# Patient Record
Sex: Female | Born: 1945 | Race: Asian | Hispanic: No | Smoking: Never smoker
Health system: Southern US, Community
[De-identification: ages and names within clinical notes are randomized; demographics above are authoritative.]

---

## 1999-06-09 ENCOUNTER — Other Ambulatory Visit: Admission: RE | Admit: 1999-06-09 | Discharge: 1999-06-09 | Payer: Self-pay | Admitting: Family Medicine

## 1999-06-21 ENCOUNTER — Encounter: Payer: Self-pay | Admitting: Family Medicine

## 1999-06-21 ENCOUNTER — Encounter: Admission: RE | Admit: 1999-06-21 | Discharge: 1999-06-21 | Payer: Self-pay | Admitting: Family Medicine

## 1999-07-11 ENCOUNTER — Encounter (INDEPENDENT_AMBULATORY_CARE_PROVIDER_SITE_OTHER): Payer: Self-pay

## 1999-07-11 ENCOUNTER — Ambulatory Visit (HOSPITAL_COMMUNITY): Admission: RE | Admit: 1999-07-11 | Discharge: 1999-07-11 | Payer: Self-pay | Admitting: Gastroenterology

## 2000-07-12 ENCOUNTER — Other Ambulatory Visit: Admission: RE | Admit: 2000-07-12 | Discharge: 2000-07-12 | Payer: Self-pay | Admitting: Family Medicine

## 2002-11-17 ENCOUNTER — Encounter (INDEPENDENT_AMBULATORY_CARE_PROVIDER_SITE_OTHER): Payer: Self-pay | Admitting: *Deleted

## 2002-11-17 ENCOUNTER — Ambulatory Visit (HOSPITAL_COMMUNITY): Admission: RE | Admit: 2002-11-17 | Discharge: 2002-11-17 | Payer: Self-pay | Admitting: Gastroenterology

## 2002-12-22 ENCOUNTER — Encounter: Admission: RE | Admit: 2002-12-22 | Discharge: 2002-12-22 | Payer: Self-pay | Admitting: Family Medicine

## 2002-12-22 ENCOUNTER — Encounter: Payer: Self-pay | Admitting: Family Medicine

## 2010-04-21 ENCOUNTER — Encounter: Admission: RE | Admit: 2010-04-21 | Discharge: 2010-04-21 | Payer: Self-pay | Admitting: Internal Medicine

## 2010-10-06 NOTE — Op Note (Signed)
   NAME:  Megan Stewart, Megan Stewart                        ACCOUNT NO.:  [id number]   MEDICAL RECORD NO.:  [id number]                   PATIENT TYPE:  AMB   LOCATION:  ENDO                                 FACILITY:  [facility]   PHYSICIAN:  James L. Malon Kindle., M.D.          DATE OF BIRTH:  1945-08-23   DATE OF PROCEDURE:  11/17/2002  DATE OF DISCHARGE:                                 OPERATIVE REPORT   PROCEDURE:  Colonoscopy with polypectomy.   MEDICATIONS:  Fentanyl 62.5 mcg, Versed 5 mg IV.   INDICATIONS FOR PROCEDURE:  This is done as a followup for a previous  history of adenomatous colon polyps.   DESCRIPTION OF PROCEDURE:  The procedure had been explained to the patient  and consent obtained. With the patient in the left lateral decubitus  position, the Olympus pediatric adjustable colonoscope was inserted and  advanced under direct visualization. The prep was excellent. We were able to  advance to the cecum using abdominal pressure. The ileocecal valve and  appendiceal orifice were seen.  The scope was withdrawn and the cecum,  ascending colon, hepatic flexure, transverse,  descending and sigmoid colon  were seen well. No polyps were seen. The descending colon was also seen  well. In the distal sigmoid, approximately 25 cm from the anal verge, a 1/2  cm sessile polyp was encountered and was removed with the snare and sucked  through the scope. There was no significant diverticular disease. The scope  was withdrawn, no other significant findings. The patient tolerated the  procedure well.   ASSESSMENT:  Sigmoid colon polyp removed.   PLAN:  Will check path. Routine post polypectomy instructions. Will  recommend repeating procedure in three years.                                               James L. Malon Kindle., M.D.    Waldron Session  D:  11/17/2002  T:  11/17/2002  Job:  [id number]   cc:   Mosetta Putt, M.D.  [address] [place]  [place] [address]  Fax: [phone]

## 2012-01-16 ENCOUNTER — Ambulatory Visit
Admission: RE | Admit: 2012-01-16 | Discharge: 2012-01-16 | Disposition: A | Discharge status: Home / Self Care | Payer: BC Managed Care – PPO | Source: Ambulatory Visit | Attending: Internal Medicine | Admitting: Internal Medicine

## 2012-01-16 ENCOUNTER — Other Ambulatory Visit: Payer: Self-pay | Admitting: Internal Medicine

## 2012-01-16 DIAGNOSIS — R05 Cough: Secondary | ICD-10-CM

## 2012-09-29 ENCOUNTER — Other Ambulatory Visit: Payer: Self-pay | Admitting: Internal Medicine

## 2012-09-29 DIAGNOSIS — E213 Hyperparathyroidism, unspecified: Secondary | ICD-10-CM

## 2012-09-30 ENCOUNTER — Ambulatory Visit
Admission: RE | Admit: 2012-09-30 | Discharge: 2012-09-30 | Disposition: A | Discharge status: Home / Self Care | Payer: BC Managed Care – PPO | Source: Ambulatory Visit | Attending: Internal Medicine | Admitting: Internal Medicine

## 2012-09-30 DIAGNOSIS — E213 Hyperparathyroidism, unspecified: Secondary | ICD-10-CM

## 2013-01-09 ENCOUNTER — Other Ambulatory Visit: Payer: Self-pay | Admitting: Internal Medicine

## 2013-01-09 DIAGNOSIS — N951 Menopausal and female climacteric states: Secondary | ICD-10-CM

## 2013-01-12 ENCOUNTER — Ambulatory Visit
Admission: RE | Admit: 2013-01-12 | Discharge: 2013-01-12 | Disposition: A | Discharge status: Home / Self Care | Payer: BC Managed Care – PPO | Source: Ambulatory Visit | Attending: Internal Medicine | Admitting: Internal Medicine

## 2013-01-12 DIAGNOSIS — N951 Menopausal and female climacteric states: Secondary | ICD-10-CM

## 2013-06-30 ENCOUNTER — Ambulatory Visit
Admission: RE | Admit: 2013-06-30 | Discharge: 2013-06-30 | Disposition: A | Discharge status: Home / Self Care | Payer: BC Managed Care – PPO | Source: Ambulatory Visit | Attending: Internal Medicine | Admitting: Internal Medicine

## 2013-06-30 ENCOUNTER — Other Ambulatory Visit: Payer: Self-pay | Admitting: Internal Medicine

## 2013-06-30 DIAGNOSIS — S66819A Strain of other specified muscles, fascia and tendons at wrist and hand level, unspecified hand, initial encounter: Principal | ICD-10-CM

## 2013-06-30 DIAGNOSIS — S63599A Other specified sprain of unspecified wrist, initial encounter: Secondary | ICD-10-CM

## 2013-06-30 IMAGING — CR Imaging study
2 series · 2 of 2 positions shown · non-contrast
Comparison: None.

CLINICAL DATA: Right-sided wrist pain

EXAM:
RIGHT WRIST - 2 VIEW

[view not recorded (1 of 2)]
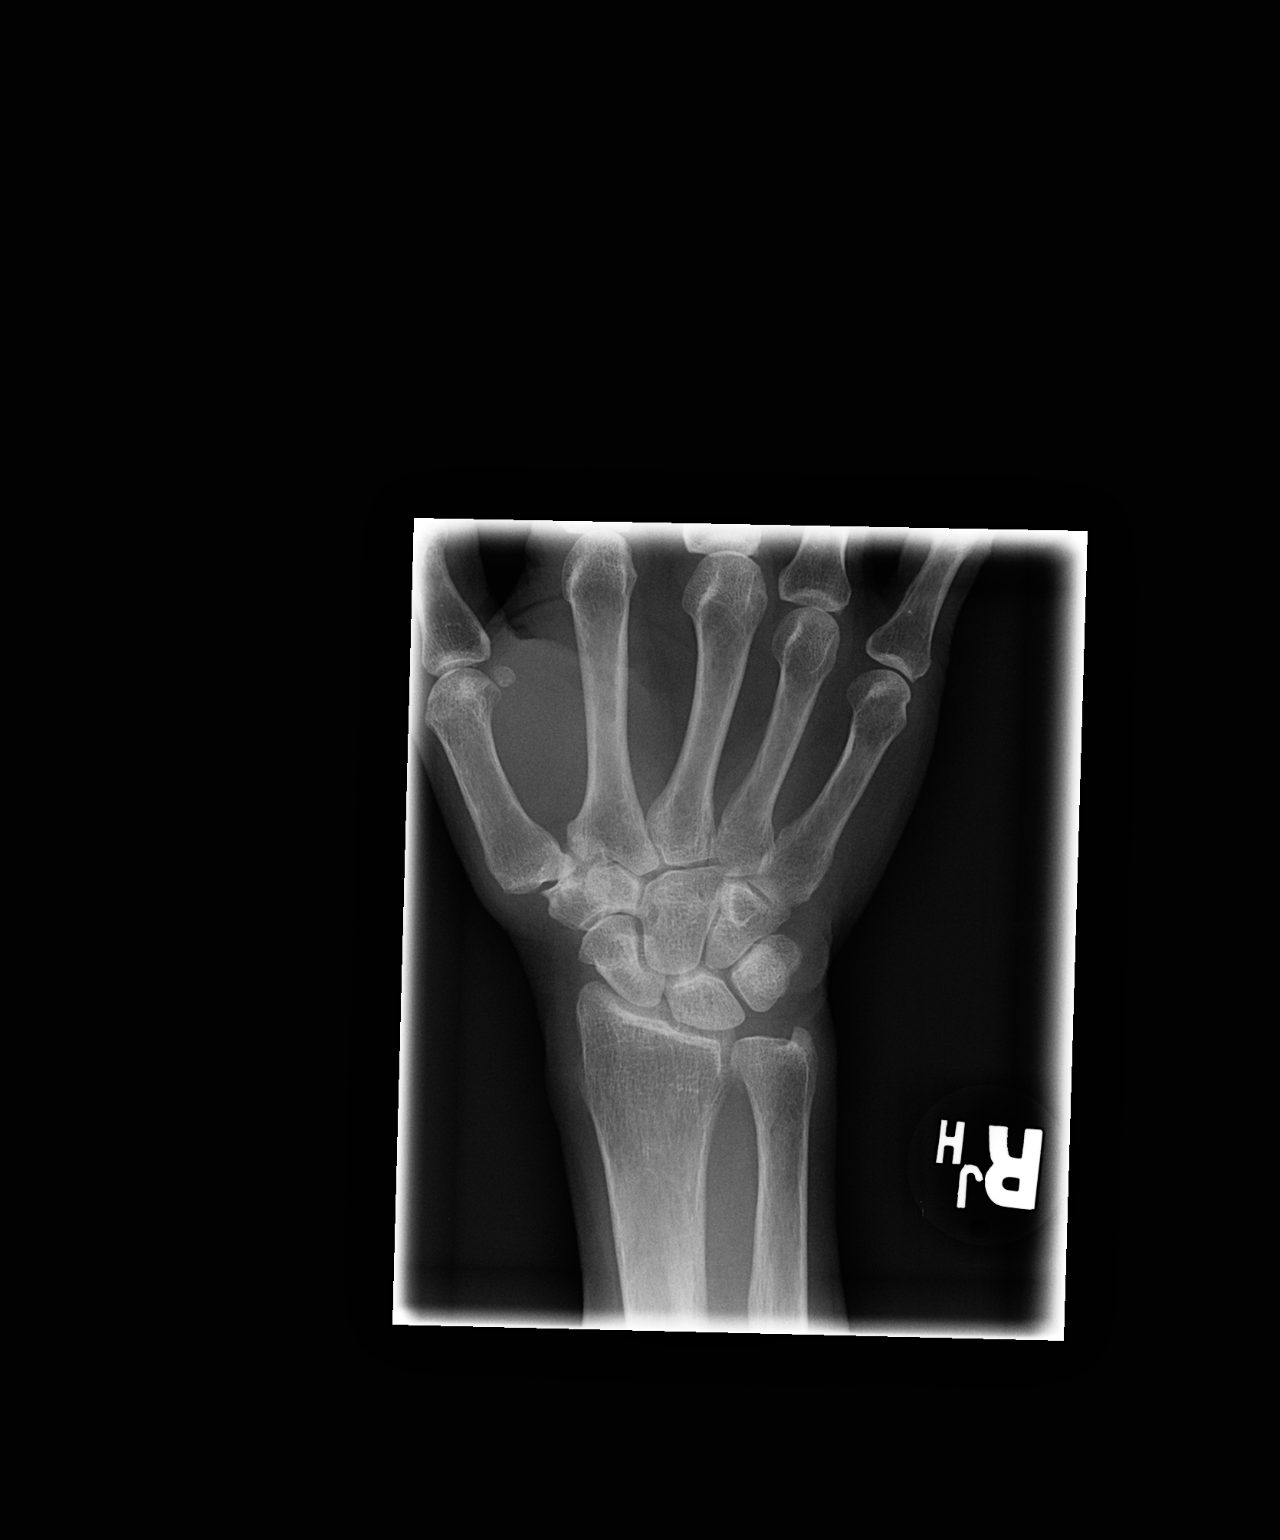

[view not recorded (2 of 2)]
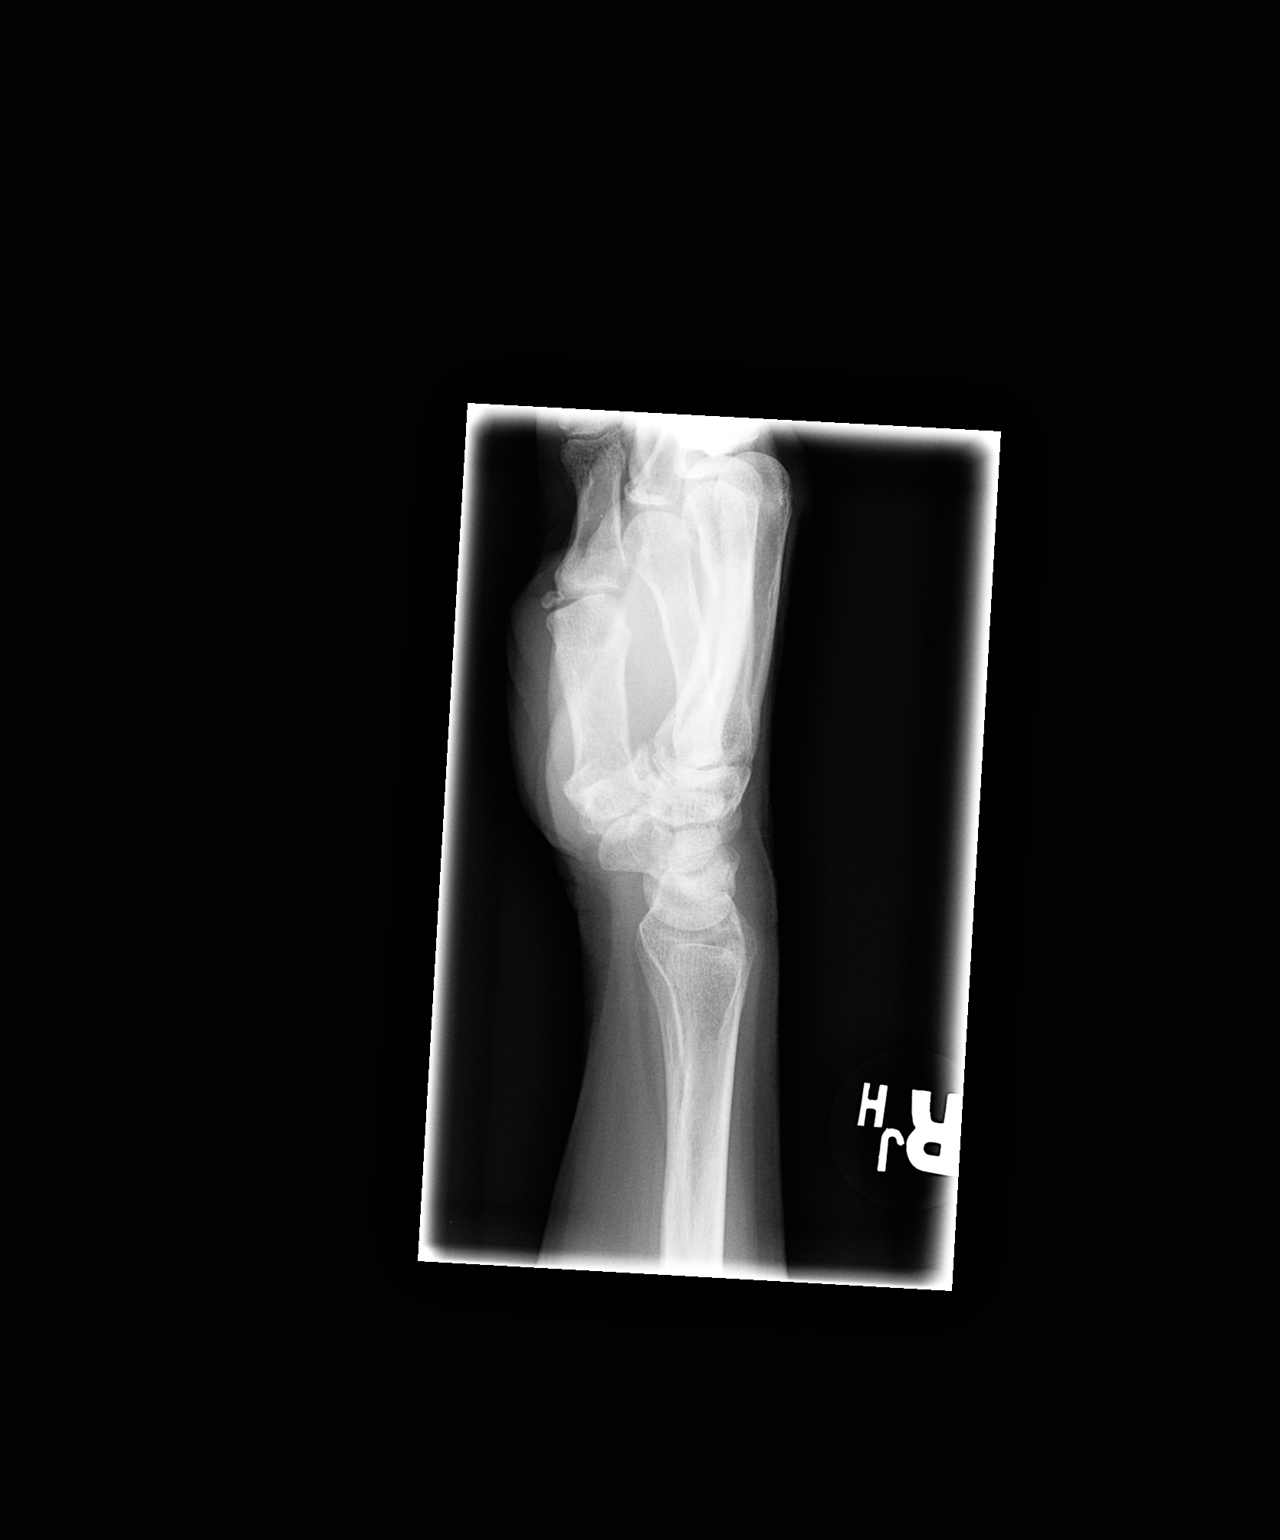

[2 of 2 positions shown; findings below may reference images not displayed]

FINDINGS: Mild degenerative changes are noted at the 1st CMC joint. No acute
fracture or dislocation is noted. No soft tissue changes are noted.
IMPRESSION: Mild degenerative changes.

## 2013-07-28 ENCOUNTER — Encounter (INDEPENDENT_AMBULATORY_CARE_PROVIDER_SITE_OTHER): Payer: Self-pay | Admitting: Surgery

## 2013-07-28 ENCOUNTER — Ambulatory Visit (INDEPENDENT_AMBULATORY_CARE_PROVIDER_SITE_OTHER): Payer: BC Managed Care – PPO | Admitting: Surgery

## 2013-07-28 ENCOUNTER — Encounter (INDEPENDENT_AMBULATORY_CARE_PROVIDER_SITE_OTHER): Payer: Self-pay

## 2013-07-28 VITALS — BP 100/64 | HR 70 | Temp 98.3°F | Resp 14 | Ht 64.0 in | Wt 120.8 lb

## 2013-07-28 DIAGNOSIS — E349 Endocrine disorder, unspecified: Secondary | ICD-10-CM

## 2013-07-28 NOTE — Progress Notes (Signed)
General Surgery [facility]  Chief Complaint  Patient presents with  . New Evaluation    elevated intact PTH level - referral from Dr. Allena Napoleon    HISTORY: Patient is a 68 year old woman referred by her primary care physician for evaluation of an elevated intact PTH level. Her most recent PTH level was elevated at 80.1 with a range of normal being 14-72. Concurrently her calcium level was high normal at 10.4. Her vitamin D level was normal at 58. Patient had a thyroid ultrasound performed last year which showed a normal thyroid gland without nodules and no evidence of a parathyroid adenoma.  Patient has had some mild bone and joint discomfort, particularly in the left wrist. She denies fatigue. She denies nephrolithiasis.  There is a history of thyroid disease and the patient's brother who underwent thyroid surgery. There is no family history of other endocrine neoplasm.  History reviewed. No pertinent past medical history.  No current outpatient prescriptions on file.   No current facility-administered medications for this visit.    No Known Allergies  Family History  Problem Relation Age of Onset  . Colon cancer Mother   . Liver cancer Mother   . Stroke Father     History   Social History  . Marital Status: Widowed    Spouse Name: N/A    Number of Children: N/A  . Years of Education: N/A   Social History Main Topics  . Smoking status: Never Smoker   . Smokeless tobacco: None  . Alcohol Use: No  . Drug Use: 1.00 per week  . Sexual Activity: None   Other Topics Concern  . None   Social History Narrative  . None    REVIEW OF SYSTEMS - PERTINENT POSITIVES ONLY: Denies fractures. Denies nephrolithiasis. Personal history of osteopenia. Denies tremor. Denies palpitations. Denies compressive symptoms.  EXAM: Filed Vitals:   07/28/13 1503  BP: 100/64  Pulse: 70  Temp: 98.3 F (36.8 C)  Resp: 14    GENERAL: well-developed,  well-nourished, no acute distress HEENT: normocephalic; pupils equal and reactive; sclerae clear; dentition good; mucous membranes moist NECK:  No palpable nodules in the thyroid bed; symmetric on extension; no palpable anterior or posterior cervical lymphadenopathy; no supraclavicular masses; no tenderness CHEST: clear to auscultation bilaterally without rales, rhonchi, or wheezes CARDIAC: regular rate and rhythm without significant murmur; peripheral pulses are full EXT:  non-tender without edema; no deformity NEURO: no gross focal deficits; no sign of tremor   LABORATORY RESULTS: See [facility] ([facility]L-Epic) for most recent results  RADIOLOGY RESULTS: See [facility] (CHL-Epic) for most recent results  IMPRESSION: #1 elevated intact PTH level, rule out primary hyperparathyroidism  PLAN: I reviewed the above studies at length with the patient. We looked at her ultrasound results and her laboratory studies. At this point she has an elevated intact PTH level. Calcium levels have always been below a level. She does have a history of osteopenia but no other complications related to hypercalcemia.  I would like to obtain a 24-hour urine collection for calcium. We will also obtain a nuclear medicine parathyroid scan with sestamibi. Once those results are available I will contact the patient and discuss further options for evaluation or management.  I provided the patient with written literature on parathyroid disease to review at home. We also discussed the possibility of parathyroid surgery and I explained minimally invasive parathyroidectomy and its risks to the patient.  Velora Heckler, MD, FACS General &  Endocrine Surgery [facility]  Primary Care Physician: Mia CreekVAUGHAN,ELIZABETH, MD

## 2013-07-28 NOTE — Patient Instructions (Signed)
Parathyroid Hormone This is a test to determine whether PTH levels are responding normally to changes in blood calcium levels. It also helps to distinguish the cause of calcium imbalances, and to evaluate parathyroid function. When calcium blood levels are higher or lower than normal, and when your caregiver may want to determine how well your parathyroid glands are working. Parathyroid hormone (PTH) helps the body maintain stable levels of calcium in the blood. It is part of a 'feedback loop' that includes calcium, PTH, vitamin D, and, to some extent, phosphate and magnesium. Conditions and diseases that disrupt this feedback loop can cause inappropriate elevations or decreases in calcium and PTH levels and lead to symptoms of hypercalcemia (raised blood levels of calcium) or hypocalcemia (low blood levels of calcium).  PTH is produced by four parathyroid glands that are located in the neck beside the thyroid gland. Normally, these glands secrete PTH into the bloodstream in response to low blood calcium levels. Parathyroid hormone then works in three ways to help raise blood calcium levels back to normal. It takes calcium from the body's bone, stimulates the activation of vitamin D in the kidney (which in turn increases the absorption of calcium from the intestines), and suppresses the excretion of calcium in the urine (while encouraging excretion of phosphate). As calcium levels begin to increase in the blood, PTH normally decreases. PREPARATION FOR TEST You should have nothing to eat or drink except for water after midnight on the day of the test or as directed by your caregiver. A blood sample is obtained by inserting a needle into a vein in the arm. NORMAL FINDINGS Conventional Normal  PTH intact (whole)  Assay includes intact PTH  Values (pg/mL)10-65  SI Units (ng/L)10-65  PTH N-terminalN-terminal  Values (pg/mL) 8-24  SI Units (ng/L)8-24  PTH C-terminal  Assay Includes  C-terminal  Values (pg/mL) 50-330  SI Units (ng/L) 50-330  Intact PTH  Midmolecule Ranges for normal findings may vary among different laboratories and hospitals. You should always check with your doctor after having lab work or other tests done to discuss the meaning of your test results and whether your values are considered within normal limits. MEANING OF TEST  Your caregiver will go over the test results with you and discuss the importance and meaning of your results, as well as treatment options and the need for additional tests if necessary. OBTAINING THE TEST RESULTS  It is your responsibility to obtain your test results. Ask the lab or department performing the test when and how you will get your results. Document Released: 06/09/2004 Document Revised: 07/30/2011 Document Reviewed: 04/18/2008 Exit[employer] Patient Information 2014 [employer].  

## 2013-09-10 ENCOUNTER — Encounter (HOSPITAL_COMMUNITY): Payer: BC Managed Care – PPO

## 2013-09-25 ENCOUNTER — Telehealth (INDEPENDENT_AMBULATORY_CARE_PROVIDER_SITE_OTHER): Payer: Self-pay

## 2013-09-25 NOTE — Telephone Encounter (Signed)
Message copied by Joanette GulaSMITHEY, Leo Fray on Fri Sep 25, 2013  4:56 PM ------      Message from: Kerrin ChampagneLOWERY, TENA M      Created: Fri Sep 25, 2013  4:41 PM      Contact: [phone]       Pt Needs to  Reschedule thyroid test on MAY 15. ------

## 2013-09-25 NOTE — Telephone Encounter (Signed)
Pt given phone # to call and r/s test to a date that is better for her.

## 2013-10-02 ENCOUNTER — Ambulatory Visit (HOSPITAL_COMMUNITY): Payer: BC Managed Care – PPO

## 2013-10-02 ENCOUNTER — Encounter (HOSPITAL_COMMUNITY): Payer: BC Managed Care – PPO

## 2013-10-23 ENCOUNTER — Encounter (HOSPITAL_COMMUNITY)
Admission: RE | Admit: 2013-10-23 | Discharge: 2013-10-23 | Disposition: A | Discharge status: Home / Self Care | Payer: BC Managed Care – PPO | Source: Ambulatory Visit | Attending: Surgery | Admitting: Surgery

## 2013-10-23 ENCOUNTER — Ambulatory Visit (HOSPITAL_COMMUNITY)
Admission: RE | Admit: 2013-10-23 | Discharge: 2013-10-23 | Disposition: A | Discharge status: Home / Self Care | Payer: BC Managed Care – PPO | Source: Ambulatory Visit | Attending: Surgery | Admitting: Surgery

## 2013-10-23 ENCOUNTER — Encounter (HOSPITAL_COMMUNITY): Admission: RE | Admit: 2013-10-23 | Payer: BC Managed Care – PPO | Source: Ambulatory Visit

## 2013-10-23 DIAGNOSIS — E213 Hyperparathyroidism, unspecified: Secondary | ICD-10-CM | POA: Insufficient documentation

## 2013-10-23 DIAGNOSIS — E349 Endocrine disorder, unspecified: Secondary | ICD-10-CM

## 2013-10-23 MED ORDER — TECHNETIUM TC 99M SESTAMIBI - CARDIOLITE
25.0000 | Freq: Once | INTRAVENOUS | Status: AC | PRN
  Administered 2013-10-23: 12:00:00 25 via INTRAVENOUS

## 2013-10-26 ENCOUNTER — Other Ambulatory Visit (INDEPENDENT_AMBULATORY_CARE_PROVIDER_SITE_OTHER): Payer: Self-pay | Admitting: Surgery

## 2013-10-27 LAB — CALCIUM, URINE, 24 HOUR
Calcium, 24 hour urine: 185 mg/d (ref 100–250)
Calcium, Ur: 9 mg/dL

## 2013-10-27 NOTE — Progress Notes (Signed)
Quick Note:  Nuclear scan shows no evidence of parathyroid adenoma. Previous USN shows no sign of adenoma.  24 hour urine collection normal for calcium excretion.  Calcium is under 11.  To Dr. Alessandra Bevels: Lanora Manis, while patient may have low-grade primary hyperparathyroidism, I would recommend continued monitoring with a repeat intact-PTH level and calcium level in 6 months. Please forward these results to me for review. I would not recommend exploratory surgery at this time.  Velora Heckler, MD, FACS General & Endocrine Surgery [facility] Office: [phone]  ______

## 2013-10-27 NOTE — Progress Notes (Signed)
Quick Note:  24 hour urinary calcium is normal.  Velora Heckler, MD, [facility] Office: [phone]   ______

## 2014-05-31 ENCOUNTER — Ambulatory Visit: Payer: BC Managed Care – PPO | Admitting: Sports Medicine

## 2014-06-15 ENCOUNTER — Encounter: Payer: Self-pay | Admitting: Sports Medicine

## 2014-06-15 ENCOUNTER — Ambulatory Visit (INDEPENDENT_AMBULATORY_CARE_PROVIDER_SITE_OTHER): Payer: BLUE CROSS/BLUE SHIELD | Admitting: Sports Medicine

## 2014-06-15 VITALS — BP 129/83 | Ht 64.0 in | Wt 120.0 lb

## 2014-06-15 DIAGNOSIS — M778 Other enthesopathies, not elsewhere classified: Secondary | ICD-10-CM | POA: Diagnosis not present

## 2014-06-15 DIAGNOSIS — M25532 Pain in left wrist: Secondary | ICD-10-CM | POA: Diagnosis not present

## 2014-06-15 MED ORDER — NITROGLYCERIN 0.2 MG/HR TD PT24: MEDICATED_PATCH | TRANSDERMAL | Status: DC

## 2014-06-15 NOTE — Assessment & Plan Note (Signed)
Rarely uses meds Modify lifting  Use compression wrap

## 2014-06-15 NOTE — Assessment & Plan Note (Signed)
Trial with topical NTG Compression HEP with ball squeezes  Reck 6 wks

## 2014-06-15 NOTE — Patient Instructions (Signed)

## 2014-06-15 NOTE — Progress Notes (Signed)
Subjective:    Patient ID: Megan Stewart, female    DOB: 12/31/1945, 69 y.o.   MRN: [id number]  HPI Ms. Megan Stewart is a 69 yo female who presents with left wrist pain. The pain has been present for the past year. The pain is located over the anatomical snuff box and is associated with persistent swelling. In February of 2015, she had radiographs performed which showed degenerative changes within the radiocarpal joint and scaphoid remodeling. Since that time she has continued to have pain and swelling. While visiting her daughter in September, she had repeat radiographs performed which showed scapholunate ligament injury. She was reportedly placed into a splint for the next 7-8 weeks, and has since started PT. She reports an improvement in movement since starting the PT, but continues to have swelling and pain over the anatomical snuff box. The pain is worsened with movement, specifically flexion and eversion. She has not tried any medications for pain relief at this time. She does not report any specific injury or fall that initiated this pain. She does report travelling quite a bit, and may have hyperextended her wrist placing luggage in the overhead compartment. She has no numbness or tingling, no discoloration. The pain is localized and does not radiate.    Review of Systems  Negative other than noted in HPI.      Objective:   Physical Exam Filed Vitals:   06/15/14 1041  BP: 129/83   General: well-appearing, pleasant female in no acute distress  Left Wrist:  Inspection: prominent swelling over the anatomical snuff box; no discoloration  Palpation: palpable swelling over the anatomical snuff box; no tenderness to palpation over the scaphoid, lunate, pisiform, hamate, radial or ulnar styloids ROM: decreased by approximately 10 degrees with flexion, full extension, eversion and inversion; flexion and eversion painful Strength: 5/5 with flexion, extension, eversion, inversion, and flexion and  extension of fingers Special: pos Watson's test, neg Phalen's, neg Tinel's  Neurovascular: neurovascularly intact distally  Right Wrist:  Inspection: no swelling, discoloration, or deformity  Palpation: no tenderness to palpation over the scaphoid, lunate, pisiform, hamate, radial or ulnar styloids ROM: full ROM with flexion,extension, eversion and inversion  Strength: 5/5 with flexion, extension, eversion, inversion, and flexion and extension of fingers Special: pos Watson's test, neg Phalen's, neg Tinel's  Neurovascular: neurovascularly intact distally  MSK U/S: Ultrasound examination of the left wrist was performed in long and short axis. Compartments 1-6 were visualized. Compartment 2 showed hypoechoic region within the extensor carpi radialis longus tendon, suggestive of a tear. Scapholunate ligament laxity was visualized with dynamic testing. A hypoechoic cystic region was visualized over the anatomical snuff box, consistent with ganglion cyst formation. Spurring was seen on the dorsal aspect of the lunate.       Assessment & Plan:   1. Left Wrist Pain:  Ms. Megan Stewart's left wrist pain is most likely multifactorial in etiology. She likely has some degenerative changes, supported by the previous radiographs as well as spurring on ultrasound examination today. She also has had a scapholunate ligament tear as suggested by previous radiographs and the laxity of the ligament on examination today. The ligament laxity likely allowed for increased motion of the lunate bone, and caused damage to the overlying extensor carpi radialis longus tendon. Her previous splint likely has allowed for significant healing of the scapholunate ligament, but the tendon is still damaged. The tearing of this tendon, likely caused formation of the ganglion cyst.   - Provided patient with body helix compression  sleeve to limit swelling; encouraged to wear throughout the day  - Will start NTG protocol for 6 weeks to speed  up the healing process of the torn tendon  Follow-up in 6-8 weeks or sooner if needed.     This patient was seen with DR Darrick Penna and note was dictated by Gorden Harms, MS4.   I have reviewed and edited.  Vedia Coffer

## 2014-07-28 ENCOUNTER — Ambulatory Visit: Payer: BLUE CROSS/BLUE SHIELD | Admitting: Sports Medicine

## 2014-09-14 ENCOUNTER — Ambulatory Visit (INDEPENDENT_AMBULATORY_CARE_PROVIDER_SITE_OTHER): Payer: BLUE CROSS/BLUE SHIELD | Admitting: Sports Medicine

## 2014-09-14 ENCOUNTER — Encounter: Payer: Self-pay | Admitting: Sports Medicine

## 2014-09-14 VITALS — BP 118/60 | Ht 64.0 in | Wt 120.0 lb

## 2014-09-14 DIAGNOSIS — M25532 Pain in left wrist: Secondary | ICD-10-CM

## 2014-09-14 DIAGNOSIS — M778 Other enthesopathies, not elsewhere classified: Secondary | ICD-10-CM | POA: Diagnosis not present

## 2014-09-14 DIAGNOSIS — M24443 Recurrent dislocation, unspecified hand: Secondary | ICD-10-CM | POA: Insufficient documentation

## 2014-09-14 DIAGNOSIS — M24442 Recurrent dislocation, left hand: Secondary | ICD-10-CM | POA: Diagnosis not present

## 2014-09-14 NOTE — Patient Instructions (Signed)
Get xray performed Perform exercises as instructed The topical medicine you can try: Arnica gel or Aspercream

## 2014-09-14 NOTE — Progress Notes (Signed)
  Megan Stewart - 69 y.o. female MRN [id number]  Date of birth: 10/05/1945  SUBJECTIVE:  Including CC & ROS.  Patient is a pleasant 69 year old female presented today for follow-up left wrist and thumb pain. Patient has a history of pain in this region for the past 2 years. No known injury to her hand or wrist but majority of her pain was localized over the MCP joint, and scaphoid bone of her right thumb. Patient condition was last seen in January 2016 and was found to have degenerative changes seen on x-ray and scaphoid lunate ligament injury with gapping consistent with a positive "Lucina Mellowave Letterman sign". Patient was placed in a wrist and thumb spica splint and started physical therapy. She was treated with nitroglycerin for suspected extensor carpi radialis tendinitis which she discontinued due to headache. She was also given a wrist compression sleeve which he found to be beneficial for her scaphoid lunate ligament injury. She also has been treating with Congohinese medicine of a topical ointment.  Today she reports continue soreness along the thumb particularly at the MTP joint that radiates into the wrist. Continued discomfort at the scaphoid bone. Reports resolution of swelling and no bruising. No numbness or tingling.   ROS: Review of systems otherwise negative except for information present in HPI  HISTORY: Past Medical, Surgical, Social, and Family History Reviewed & Updated per EMR. Pertinent Historical Findings include: No chronic medical problems or chronic medications  DATA REVIEWED: Patient had bilateral wrist x-rays from 2015 which we're unable to review in the PACS system today  PHYSICAL EXAM:  VS: BP:118/60 mmHg  HR: bpm  TEMP: ( )  RESP:   HT:5\' 4"  (162.6 cm)   WT:120 lb (54.432 kg)  BMI:20.6 LEFT WRIST EXAM: General: well nourished Skin of UE: warm; dry, no rashes, lesions, ecchymosis or erythema. Vascular: radial pulses 2+ bilaterally Neurologically: Sensation to light touch  upper extremities equal and intact bilaterally.  Observation: No evidence of edema, swelling and ecchymosis Palpation: Mild tenderness to palpation over the anatomical snuffbox and the first MCP joint left thumb. No tenderness of the scaphoid lunate joint. Range of motion:Has hypermobility chronic dislocation of the MCP joint of thumb Full range of motion of the wrist and wrist flexion, extension, supination, pronation.   Muscle strength: No significant weakness but pain with thumb flexion extension abduction and abduction and opposition.No pain or weakness with wrist flexion of the flexor carpi ulnaris and radialis 5/5 strength.  Neurologic: Normal sensation and motor C5-T1,  Normal capillary refill Special tests: UCL ligament of the thumb intact. Negative piano sign  ASSESSMENT & PLAN: See problem based charting & AVS for pt instructions. Impression: 1. Scaphoid lunate ligament sprain 2. Left thumb MCP joint chronic dislocation 3. Persistent scaphoid tenderness to deep palpation.  Recommendations: -Recommended placing patient in a thumb spica type brace to provide more stability around her thumb. Patient was comfortable with a radial gutter brace. -We'll repeat patient's left wrist x-rays and added a fist view to evaluate the scaphoid lunate joint. -Patient can continue some topical agent such as chronic a gel or Aspercreme. -Will call patient with x-ray results

## 2018-04-04 ENCOUNTER — Ambulatory Visit
Admission: RE | Admit: 2018-04-04 | Discharge: 2018-04-04 | Disposition: A | Discharge status: Home / Self Care | Payer: Medicare Other | Source: Ambulatory Visit | Attending: Orthopaedic Surgery | Admitting: Orthopaedic Surgery

## 2018-04-04 ENCOUNTER — Other Ambulatory Visit: Payer: Self-pay | Admitting: Orthopaedic Surgery

## 2018-04-04 DIAGNOSIS — T148XXA Other injury of unspecified body region, initial encounter: Secondary | ICD-10-CM

## 2018-04-04 DIAGNOSIS — M542 Cervicalgia: Secondary | ICD-10-CM

## 2019-06-18 ENCOUNTER — Ambulatory Visit: Payer: Medicare Other

## 2019-06-26 ENCOUNTER — Ambulatory Visit: Payer: Medicare Other | Attending: Internal Medicine

## 2019-06-26 DIAGNOSIS — Z23 Encounter for immunization: Secondary | ICD-10-CM | POA: Insufficient documentation

## 2019-06-26 NOTE — Progress Notes (Signed)
   Covid-19 Vaccination Clinic  Name:  Megan Stewart    MRN: [id number] DOB: 01-Jul-1945  06/26/2019  Ms. Megan Stewart was observed post Covid-19 immunization for 15 minutes without incidence. She was provided with Vaccine Information Sheet and instruction to access the V-Safe system.   Ms. Megan Stewart was instructed to call 911 with any severe reactions post vaccine: Marland Kitchen Difficulty breathing  . Swelling of your face and throat  . A fast heartbeat  . A bad rash all over your body  . Dizziness and weakness    Immunizations Administered    Name Date Dose VIS Date Route   Pfizer COVID-19 Vaccine 06/26/2019  5:07 PM 0.3 mL 05/01/2019 Intramuscular   Manufacturer: [employer], [employer]   Lot: [id number]   NDC: [address]-5

## 2019-07-09 ENCOUNTER — Ambulatory Visit: Payer: Medicare Other

## 2019-07-21 ENCOUNTER — Ambulatory Visit: Payer: Medicare Other | Attending: Internal Medicine

## 2019-07-21 ENCOUNTER — Ambulatory Visit: Payer: Medicare Other

## 2019-07-21 DIAGNOSIS — Z23 Encounter for immunization: Secondary | ICD-10-CM

## 2019-07-21 NOTE — Progress Notes (Signed)
   Covid-19 Vaccination Clinic  Name:  Megan Stewart    MRN: [id number] DOB: Jun 12, 1945  07/21/2019  [name] was observed post Covid-19 immunization for 15 minutes without incident. She was provided with Vaccine Information Sheet and instruction to access the V-Safe system.   [name] was instructed to call 911 with any severe reactions post vaccine: Marland Kitchen Difficulty breathing  . Swelling of face and throat  . A fast heartbeat  . A bad rash all over body  . Dizziness and weakness   Immunizations Administered    Name Date Dose VIS Date Route   Pfizer COVID-19 Vaccine 07/21/2019  2:12 PM 0.3 mL 05/01/2019 Intramuscular   Manufacturer: [employer], [employer]   Lot: [id number]   NDC: [address]-0

## 2021-01-31 ENCOUNTER — Other Ambulatory Visit: Payer: Self-pay

## 2021-01-31 ENCOUNTER — Ambulatory Visit: Payer: Medicare Other | Attending: Internal Medicine | Admitting: Occupational Therapy

## 2021-01-31 DIAGNOSIS — R208 Other disturbances of skin sensation: Secondary | ICD-10-CM

## 2021-01-31 DIAGNOSIS — M6281 Muscle weakness (generalized): Secondary | ICD-10-CM | POA: Insufficient documentation

## 2021-01-31 DIAGNOSIS — R278 Other lack of coordination: Secondary | ICD-10-CM | POA: Diagnosis present

## 2021-01-31 DIAGNOSIS — M25541 Pain in joints of right hand: Secondary | ICD-10-CM | POA: Insufficient documentation

## 2021-01-31 DIAGNOSIS — M25641 Stiffness of right hand, not elsewhere classified: Secondary | ICD-10-CM | POA: Insufficient documentation
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
## 2021-01-31 NOTE — Patient Instructions (Signed)
  Flexor Tendon Gliding (Active Hook Fist)   With fingers and knuckles straight, bend middle and tip joints. Do not bend large knuckles. Repeat _10-15___ times. Do _4-6___ sessions per day.  MP Flexion (Active)   With back of hand on table, bend large knuckles as far as they will go, keeping small joints straight. Repeat _10-15___ times. Do __4-6__ sessions per day.      Finger Flexion / Extension   With palm up, bend fingers of left hand toward palm, making a  fist. Straighten fingers, opening fist. Repeat sequence _10-15___ times per session. Do _4-6__ sessions per day.  Palmar Adduction/Abduction (Active)    Move thumb down, in front of palm. Move back to rest along palm. Repeat _10-15___ times. Do __4-6__ sessions per day.

## 2021-02-06 ENCOUNTER — Encounter: Payer: Self-pay | Admitting: Occupational Therapy

## 2021-02-06 ENCOUNTER — Ambulatory Visit: Payer: Medicare Other | Admitting: Occupational Therapy

## 2021-02-06 ENCOUNTER — Other Ambulatory Visit: Payer: Self-pay

## 2021-02-06 DIAGNOSIS — M6281 Muscle weakness (generalized): Secondary | ICD-10-CM

## 2021-02-06 DIAGNOSIS — R278 Other lack of coordination: Secondary | ICD-10-CM

## 2021-02-06 DIAGNOSIS — M25641 Stiffness of right hand, not elsewhere classified: Secondary | ICD-10-CM

## 2021-02-06 DIAGNOSIS — M25541 Pain in joints of right hand: Secondary | ICD-10-CM

## 2021-02-06 DIAGNOSIS — R208 Other disturbances of skin sensation: Secondary | ICD-10-CM
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
## 2021-02-06 NOTE — Patient Instructions (Signed)
   Your Splint This splint should initially be fitted by a healthcare practitioner.  The healthcare practitioner is responsible for providing wearing instructions and precautions to the patient, other healthcare practitioners and care provider involved in the patient's care.  This splint was custom made for you. Please read the following instructions to learn about wearing and caring for your splint.  Precautions Should your splint cause any of the following problems, remove the splint immediately and contact your therapist/physician. Swelling Severe Pain Pressure Areas Stiffness Numbness  Do not wear your splint while operating machinery unless it has been fabricated for that purpose.  When To Wear Your Splint Where your splint according to your therapist/physician instructions. Nights and rest periods only.  Wear during the day initially and make sure it is not rubbing or causing any problems, if not problems after first 2 days, you can wear at night.  Care and Cleaning of Your Splint Keep your splint away from open flames. Your splint will lose its shape in temperatures over 135 degrees Farenheit, ( in car windows, near radiators, ovens or in hot water).  Never make any adjustments to your splint, if the splint needs adjusting remove it and make an appointment to see your therapist. Your splint, including the cushion liner may be cleaned with soap and lukewarm water.  Do not immerse in hot water over 135 degrees Farenheit. Straps may be washed with soap and water, but do not moisten the self-adhesive portion. For ink or hard to remove spots use a scouring cleanser which contains chlorine.  Rinse the splint thoroughly after using chlorine cleanser.

## 2021-02-08 ENCOUNTER — Encounter: Payer: Self-pay | Admitting: Occupational Therapy

## 2021-02-08 ENCOUNTER — Other Ambulatory Visit: Payer: Self-pay

## 2021-02-08 ENCOUNTER — Ambulatory Visit: Payer: Medicare Other | Admitting: Occupational Therapy

## 2021-02-08 DIAGNOSIS — R278 Other lack of coordination: Secondary | ICD-10-CM

## 2021-02-08 DIAGNOSIS — M25541 Pain in joints of right hand: Secondary | ICD-10-CM | POA: Diagnosis not present

## 2021-02-08 DIAGNOSIS — M25641 Stiffness of right hand, not elsewhere classified: Secondary | ICD-10-CM

## 2021-02-08 DIAGNOSIS — R208 Other disturbances of skin sensation: Secondary | ICD-10-CM

## 2021-02-08 DIAGNOSIS — M6281 Muscle weakness (generalized): Secondary | ICD-10-CM
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
## 2021-02-08 NOTE — Patient Instructions (Addendum)
Opposition (Active)   Touch tip of thumb to nail tip of each finger in turn, making an "O" shape. Repeat __10__ times. Do _4-6___ sessions per day.   MP Flexion (Active)   Bend thumb to touch base of little finger, keeping tip joint straight. Repeat __10-15__ times. Do _4-6___ sessions per day.       IP Flexion (Active Blocked)   Brace thumb below tip joint. Bend joint as far as possible. Repeat __10__ times. Do _4-6___ sessions per day.   Composite Extension (Active)   Bring thumb up and out in hitchhiker position.  Repeat __10-15__ times. Do _4-6___ sessions per day.     Desensitization Techniques  Perform these exercises ever 2 hours for 15 minute sessions.  Progress to the next exercises when the exercises you are doing become easy.  1)  Using light pressure, rub the various textures along with the hypersensitive area:  A.  Flannel  E.  Polyester  B.  Velvet  F.  Corduroy  C.  Wool  G.  Cotton material  D.  Terry cloth  2)  With the same textures use a firmer pressure. 3)  Use a hand held vibrator and massage along the sensitive area. 4)  With a small dowel rod, eraser on a pencil or base of an ink pen tap along the sensitive area. 5)  Use an empty roll-on deodorant bottle to roll along the sensitive area. 6)  Place your hand/forearm in separate containers of the following items:  A.  Sand  D.  Dry lentil beans  B.  Dry Rice  E.  Dry kidney beans  C.  Ball bearings F.  Dry pinto beans  Scar Massage Purpose: To soften/smooth scar tissue.   To desensitize sensitive areas after surgery.   To mechanically break up inner scar tissue, adhesions, therefore allowing freer        movement of injured tendons and muscle.  Technique: Use a cream to massage with, as it insures a smooth gliding motion and avoids irritation    caused by rubbing skin to skin.  Cream is preferred over a lotion.   May begin as soon as any suture areas are healed.   Apply a firm, steady  pressure with your finger-tip, pulling the skin in a circular motion over the   scarred area.  Do not rub.  Message 5 minutes, at least 1-2 times a day, unless you are getting tender afterwards

## 2021-02-13 ENCOUNTER — Ambulatory Visit: Payer: Medicare Other | Admitting: Occupational Therapy

## 2021-02-13 ENCOUNTER — Other Ambulatory Visit: Payer: Self-pay

## 2021-02-13 DIAGNOSIS — M25541 Pain in joints of right hand: Secondary | ICD-10-CM

## 2021-02-13 DIAGNOSIS — M25641 Stiffness of right hand, not elsewhere classified: Secondary | ICD-10-CM

## 2021-02-13 DIAGNOSIS — R278 Other lack of coordination: Secondary | ICD-10-CM

## 2021-02-13 NOTE — Patient Instructions (Signed)
  1. Grip Strengthening (Resistive Putty)   Squeeze putty using thumb and all fingers. Repeat _20___ times. Do __2__ sessions per day.   2. Roll putty into tube on table and pinch between first finger and thumb x 10 reps. Do 2 sessions per day  3. Finger Extension / Thumb Abduction: Resisted    With rubber band around right thumb and ___all_____ fingers, hand slightly cupped, gently spread thumb and fingers apart. Repeat __10__ times per set.  Do __2__ sessions per day.
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
## 2021-02-15 ENCOUNTER — Other Ambulatory Visit: Payer: Self-pay

## 2021-02-15 ENCOUNTER — Ambulatory Visit: Payer: Medicare Other | Admitting: Occupational Therapy

## 2021-02-15 DIAGNOSIS — M6281 Muscle weakness (generalized): Secondary | ICD-10-CM

## 2021-02-15 DIAGNOSIS — R278 Other lack of coordination: Secondary | ICD-10-CM

## 2021-02-15 DIAGNOSIS — R208 Other disturbances of skin sensation: Secondary | ICD-10-CM

## 2021-02-15 DIAGNOSIS — M25641 Stiffness of right hand, not elsewhere classified: Secondary | ICD-10-CM

## 2021-02-15 DIAGNOSIS — M25541 Pain in joints of right hand: Secondary | ICD-10-CM | POA: Diagnosis not present
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
## 2021-02-15 NOTE — Patient Instructions (Signed)
Finger and Thumb Extension (Resistive Putty)    With thumb and all fingers in center of putty donut, stretch out. Repeat __5-10__ times. Do _1___ sessions per day.  Copyright  VHI. All rights reserved.

## 2021-02-20 ENCOUNTER — Other Ambulatory Visit: Payer: Self-pay

## 2021-02-20 ENCOUNTER — Ambulatory Visit: Payer: Medicare Other | Attending: Internal Medicine | Admitting: Occupational Therapy

## 2021-02-20 DIAGNOSIS — M6281 Muscle weakness (generalized): Secondary | ICD-10-CM | POA: Diagnosis present

## 2021-02-20 DIAGNOSIS — M25541 Pain in joints of right hand: Secondary | ICD-10-CM | POA: Diagnosis not present

## 2021-02-20 DIAGNOSIS — R278 Other lack of coordination: Secondary | ICD-10-CM | POA: Diagnosis present

## 2021-02-20 DIAGNOSIS — M25641 Stiffness of right hand, not elsewhere classified: Secondary | ICD-10-CM

## 2021-02-20 DIAGNOSIS — R208 Other disturbances of skin sensation: Secondary | ICD-10-CM | POA: Diagnosis present
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
## 2021-02-22 ENCOUNTER — Ambulatory Visit: Payer: Medicare Other | Admitting: Occupational Therapy

## 2021-02-22 ENCOUNTER — Other Ambulatory Visit: Payer: Self-pay

## 2021-02-22 DIAGNOSIS — R278 Other lack of coordination: Secondary | ICD-10-CM

## 2021-02-22 DIAGNOSIS — M6281 Muscle weakness (generalized): Secondary | ICD-10-CM

## 2021-02-22 DIAGNOSIS — M25641 Stiffness of right hand, not elsewhere classified: Secondary | ICD-10-CM

## 2021-02-22 DIAGNOSIS — M25541 Pain in joints of right hand: Secondary | ICD-10-CM | POA: Diagnosis not present
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
## 2021-02-27 ENCOUNTER — Other Ambulatory Visit: Payer: Self-pay

## 2021-02-27 ENCOUNTER — Ambulatory Visit: Payer: Medicare Other | Admitting: Occupational Therapy

## 2021-02-27 DIAGNOSIS — M25641 Stiffness of right hand, not elsewhere classified: Secondary | ICD-10-CM

## 2021-02-27 DIAGNOSIS — R278 Other lack of coordination: Secondary | ICD-10-CM

## 2021-02-27 DIAGNOSIS — M25541 Pain in joints of right hand: Secondary | ICD-10-CM

## 2021-02-27 DIAGNOSIS — M6281 Muscle weakness (generalized): Secondary | ICD-10-CM
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
## 2021-03-01 ENCOUNTER — Other Ambulatory Visit: Payer: Self-pay

## 2021-03-01 ENCOUNTER — Ambulatory Visit: Payer: Medicare Other | Admitting: Occupational Therapy

## 2021-03-01 DIAGNOSIS — M25541 Pain in joints of right hand: Secondary | ICD-10-CM

## 2021-03-01 DIAGNOSIS — R278 Other lack of coordination: Secondary | ICD-10-CM

## 2021-03-01 DIAGNOSIS — M25641 Stiffness of right hand, not elsewhere classified: Secondary | ICD-10-CM
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
## 2021-03-07 ENCOUNTER — Other Ambulatory Visit: Payer: Self-pay

## 2021-03-07 ENCOUNTER — Ambulatory Visit: Payer: Medicare Other | Admitting: Occupational Therapy

## 2021-03-07 DIAGNOSIS — M25641 Stiffness of right hand, not elsewhere classified: Secondary | ICD-10-CM

## 2021-03-07 DIAGNOSIS — M25541 Pain in joints of right hand: Secondary | ICD-10-CM | POA: Diagnosis not present

## 2021-03-07 DIAGNOSIS — R208 Other disturbances of skin sensation: Secondary | ICD-10-CM
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
## 2021-03-14 ENCOUNTER — Ambulatory Visit: Payer: Medicare Other | Admitting: Occupational Therapy
